# Patient Record
Sex: Female | Born: 1955 | Race: Black or African American | Hispanic: No | Smoking: Never smoker
Health system: Southern US, Community
[De-identification: ages and names within clinical notes are randomized; demographics above are authoritative.]

## PROBLEM LIST (undated history)

## (undated) DIAGNOSIS — E7800 Pure hypercholesterolemia, unspecified: Secondary | ICD-10-CM

## (undated) DIAGNOSIS — I10 Essential (primary) hypertension: Secondary | ICD-10-CM

## (undated) HISTORY — PX: TONSILLECTOMY: SUR1361

---

## 2006-10-01 DIAGNOSIS — E7800 Pure hypercholesterolemia, unspecified: Secondary | ICD-10-CM

## 2006-10-01 HISTORY — DX: Pure hypercholesterolemia, unspecified: E78.00

## 2010-06-04 ENCOUNTER — Emergency Department (HOSPITAL_BASED_OUTPATIENT_CLINIC_OR_DEPARTMENT_OTHER): Admission: EM | Admit: 2010-06-04 | Discharge: 2010-06-04 | Payer: Self-pay | Admitting: Emergency Medicine

## 2010-06-04 ENCOUNTER — Ambulatory Visit: Payer: Self-pay | Admitting: Diagnostic Radiology

## 2011-10-02 HISTORY — PX: TUBAL LIGATION: SHX77

## 2014-07-28 ENCOUNTER — Encounter (HOSPITAL_BASED_OUTPATIENT_CLINIC_OR_DEPARTMENT_OTHER): Payer: Self-pay | Admitting: Emergency Medicine

## 2014-07-28 ENCOUNTER — Emergency Department (HOSPITAL_BASED_OUTPATIENT_CLINIC_OR_DEPARTMENT_OTHER)
Admission: EM | Admit: 2014-07-28 | Discharge: 2014-07-28 | Disposition: A | Discharge status: Home / Self Care | Payer: BC Managed Care – PPO | Attending: Emergency Medicine | Admitting: Emergency Medicine

## 2014-07-28 ENCOUNTER — Emergency Department (HOSPITAL_BASED_OUTPATIENT_CLINIC_OR_DEPARTMENT_OTHER): Payer: BC Managed Care – PPO

## 2014-07-28 DIAGNOSIS — I10 Essential (primary) hypertension: Secondary | ICD-10-CM | POA: Insufficient documentation

## 2014-07-28 DIAGNOSIS — F29 Unspecified psychosis not due to a substance or known physiological condition: Secondary | ICD-10-CM | POA: Diagnosis not present

## 2014-07-28 DIAGNOSIS — M545 Low back pain: Secondary | ICD-10-CM | POA: Diagnosis not present

## 2014-07-28 DIAGNOSIS — G8929 Other chronic pain: Secondary | ICD-10-CM | POA: Diagnosis not present

## 2014-07-28 DIAGNOSIS — Z8639 Personal history of other endocrine, nutritional and metabolic disease: Secondary | ICD-10-CM | POA: Diagnosis not present

## 2014-07-28 DIAGNOSIS — R112 Nausea with vomiting, unspecified: Secondary | ICD-10-CM | POA: Diagnosis not present

## 2014-07-28 DIAGNOSIS — R1031 Right lower quadrant pain: Secondary | ICD-10-CM | POA: Insufficient documentation

## 2014-07-28 DIAGNOSIS — M546 Pain in thoracic spine: Secondary | ICD-10-CM | POA: Insufficient documentation

## 2014-07-28 DIAGNOSIS — Z9851 Tubal ligation status: Secondary | ICD-10-CM | POA: Diagnosis not present

## 2014-07-28 DIAGNOSIS — R109 Unspecified abdominal pain: Secondary | ICD-10-CM | POA: Diagnosis present

## 2014-07-28 HISTORY — DX: Essential (primary) hypertension: I10

## 2014-07-28 HISTORY — DX: Pure hypercholesterolemia, unspecified: E78.00

## 2014-07-28 LAB — URINALYSIS, ROUTINE W REFLEX MICROSCOPIC
Bilirubin Urine: NEGATIVE
GLUCOSE, UA: NEGATIVE mg/dL
Hgb urine dipstick: NEGATIVE
KETONES UR: NEGATIVE mg/dL
LEUKOCYTES UA: NEGATIVE
NITRITE: NEGATIVE
PH: 5.5 (ref 5.0–8.0)
Protein, ur: NEGATIVE mg/dL
SPECIFIC GRAVITY, URINE: 1.003 — AB (ref 1.005–1.030)
Urobilinogen, UA: 0.2 mg/dL (ref 0.0–1.0)

## 2014-07-28 LAB — CBC WITH DIFFERENTIAL/PLATELET
BASOS PCT: 0 % (ref 0–1)
Basophils Absolute: 0 10*3/uL (ref 0.0–0.1)
Eosinophils Absolute: 0.1 10*3/uL (ref 0.0–0.7)
Eosinophils Relative: 2 % (ref 0–5)
HEMATOCRIT: 37.2 % (ref 36.0–46.0)
HEMOGLOBIN: 12.4 g/dL (ref 12.0–15.0)
LYMPHS ABS: 1.6 10*3/uL (ref 0.7–4.0)
LYMPHS PCT: 24 % (ref 12–46)
MCH: 27.5 pg (ref 26.0–34.0)
MCHC: 33.3 g/dL (ref 30.0–36.0)
MCV: 82.5 fL (ref 78.0–100.0)
MONO ABS: 0.6 10*3/uL (ref 0.1–1.0)
MONOS PCT: 9 % (ref 3–12)
NEUTROS ABS: 4.4 10*3/uL (ref 1.7–7.7)
NEUTROS PCT: 65 % (ref 43–77)
Platelets: 269 10*3/uL (ref 150–400)
RBC: 4.51 MIL/uL (ref 3.87–5.11)
RDW: 12.3 % (ref 11.5–15.5)
WBC: 6.7 10*3/uL (ref 4.0–10.5)

## 2014-07-28 LAB — COMPREHENSIVE METABOLIC PANEL
ALBUMIN: 4.1 g/dL (ref 3.5–5.2)
ALK PHOS: 72 U/L (ref 39–117)
ALT: 10 U/L (ref 0–35)
ANION GAP: 15 (ref 5–15)
AST: 18 U/L (ref 0–37)
BILIRUBIN TOTAL: 0.3 mg/dL (ref 0.3–1.2)
BUN: 22 mg/dL (ref 6–23)
CHLORIDE: 99 meq/L (ref 96–112)
CO2: 24 meq/L (ref 19–32)
CREATININE: 1.1 mg/dL (ref 0.50–1.10)
Calcium: 9.7 mg/dL (ref 8.4–10.5)
GFR calc Af Amer: 62 mL/min — ABNORMAL LOW (ref >90)
GFR, EST NON AFRICAN AMERICAN: 54 mL/min — AB (ref >90)
GLUCOSE: 125 mg/dL — AB (ref 70–99)
POTASSIUM: 4.4 meq/L (ref 3.7–5.3)
Sodium: 138 mEq/L (ref 137–147)
Total Protein: 8.2 g/dL (ref 6.0–8.3)

## 2014-07-28 MED ORDER — HYDROMORPHONE HCL 1 MG/ML IJ SOLN
0.5000 mg | Freq: Once | INTRAMUSCULAR | Status: AC
  Administered 2014-07-28: 0.5 mg via INTRAVENOUS
  Filled 2014-07-28: qty 1

## 2014-07-28 MED ORDER — IOHEXOL 300 MG/ML  SOLN
25.0000 mL | Freq: Once | INTRAMUSCULAR | Status: AC | PRN
  Administered 2014-07-28: 25 mL via ORAL

## 2014-07-28 MED ORDER — IOHEXOL 300 MG/ML  SOLN
100.0000 mL | Freq: Once | INTRAMUSCULAR | Status: AC | PRN
  Administered 2014-07-28: 100 mL via INTRAVENOUS

## 2014-07-28 MED ORDER — ONDANSETRON 4 MG PO TBDP: ORAL_TABLET | ORAL | Status: DC

## 2014-07-28 MED ORDER — ONDANSETRON HCL 4 MG/2ML IJ SOLN
4.0000 mg | Freq: Once | INTRAMUSCULAR | Status: AC
  Administered 2014-07-28: 4 mg via INTRAVENOUS
  Filled 2014-07-28: qty 2

## 2014-07-28 NOTE — ED Notes (Signed)
Patient transported to CT 

## 2014-07-28 NOTE — Discharge Instructions (Signed)

## 2014-07-28 NOTE — ED Notes (Signed)
Pt stated that yesterday she started having back pain around 1000. Yesterday evening around 1700 the pain started radiating around to her right side and abdomen. Pt stated that she went home last night after work and took ibuprofen the pain eased up some but continued through the night. Pt stated that when she woke up this morning and went to roll over and the pain was worse in her right lower back. Pt states that right now she only has right lower back pain.

## 2014-07-28 NOTE — ED Provider Notes (Signed)
CSN: [id number]     Arrival date & time 07/28/14  [id number] History   First MD Initiated Contact with Patient 07/28/14 1007     Chief Complaint  Patient presents with  . Back Pain  . Flank Pain     (Consider location/radiation/quality/duration/timing/severity/associated sxs/prior Treatment) Patient is a 58 y.o. female presenting with back pain.  Back Pain Location:  Lumbar spine Quality:  Aching and cramping Radiates to: R abdomen. Pain severity:  Severe Pain is:  Same all the time Onset quality:  Sudden Duration:  1 day Timing:  Constant Progression:  Unchanged Chronicity:  New Context: not falling, not physical stress and not recent illness   Relieved by:  Nothing Worsened by:  Nothing tried Ineffective treatments:  None tried Associated symptoms: no abdominal pain, no bladder incontinence, no bowel incontinence, no chest pain, no fever, no numbness, no perianal numbness and no tingling     Past Medical History  Diagnosis Date  . Hypertension   . High cholesterol 2008   Past Surgical History  Procedure Laterality Date  . Tonsillectomy    . Tubal ligation Left 2013   History reviewed. No pertinent family history. History  Substance Use Topics  . Smoking status: Never Smoker   . Smokeless tobacco: Never Used  . Alcohol Use: Yes     Comment: Pt stated that she drinks a couple glasses of a mixed drink occasionally on weekends   OB History   Grav Para Term Preterm Abortions TAB SAB Ect Mult Living                 Review of Systems  Constitutional: Negative for fever.  Cardiovascular: Negative for chest pain.  Gastrointestinal: Negative for abdominal pain and bowel incontinence.  Genitourinary: Negative for bladder incontinence.  Musculoskeletal: Positive for back pain.  Neurological: Negative for tingling and numbness.  All other systems reviewed and are negative.     Allergies  Review of patient's allergies indicates no known allergies.  Home Medications    Prior to Admission medications   Medication Sig Start Date End Date Taking? Authorizing Provider  ibuprofen (ADVIL,MOTRIN) 200 MG tablet Take 200 mg by mouth every 6 (six) hours as needed (Pt stated that she took 4-200mg  tablets this morning at 0055am).   Yes Historical Provider, MD  ondansetron (ZOFRAN ODT) 4 MG disintegrating tablet 4mg  ODT q4 hours prn nausea/vomit 07/28/14   Mirian MoMatthew Inayah Woodin, MD   BP 120/78  Pulse 44  Temp(Src) 98.1 F (36.7 C) (Oral)  Resp 20  Ht 5' (1.524 m)  Wt 185 lb (83.915 kg)  BMI 36.13 kg/m2  SpO2 99% Physical Exam  Vitals reviewed. Constitutional: She is oriented to person, place, and time. She appears well-developed and well-nourished.  HENT:  Head: Normocephalic and atraumatic.  Right Ear: External ear normal.  Left Ear: External ear normal.  Eyes: Conjunctivae and EOM are normal. Pupils are equal, round, and reactive to light.  Neck: Normal range of motion. Neck supple.  Cardiovascular: Normal rate, regular rhythm, normal heart sounds and intact distal pulses.   Pulmonary/Chest: Effort normal and breath sounds normal.  Abdominal: Soft. Bowel sounds are normal. There is tenderness in the right lower quadrant.  Musculoskeletal: Normal range of motion.       Cervical back: Normal.       Thoracic back: She exhibits tenderness. She exhibits no bony tenderness.       Lumbar back: She exhibits tenderness. She exhibits no bony tenderness.  Neurological: She is alert  and oriented to person, place, and time.  Skin: Skin is warm and dry.    ED Course  Procedures (including critical care time) Labs Review Labs Reviewed  URINALYSIS, ROUTINE W REFLEX MICROSCOPIC - Abnormal; Notable for the following:    Specific Gravity, Urine 1.003 (*)    All other components within normal limits  COMPREHENSIVE METABOLIC PANEL - Abnormal; Notable for the following:    Glucose, Bld 125 (*)    GFR calc non Af Amer 54 (*)    GFR calc Af Amer 62 (*)    All other  components within normal limits  CBC WITH DIFFERENTIAL    Imaging Review Ct Abdomen Pelvis W Contrast  07/28/2014   CLINICAL DATA:  Low back pain radiating to the abdomen since yesterday. History of left ovarian removal.  EXAM: CT ABDOMEN AND PELVIS WITH CONTRAST  TECHNIQUE: Multidetector CT imaging of the abdomen and pelvis was performed using the standard protocol following bolus administration of intravenous contrast.  CONTRAST:  25mL OMNIPAQUE IOHEXOL 300 MG/ML SOLN, 100mL OMNIPAQUE IOHEXOL 300 MG/ML SOLN  COMPARISON:  None.  FINDINGS: The lung bases are clear.  The liver demonstrates no focal abnormality. There is no intrahepatic or extrahepatic biliary ductal dilatation. The gallbladder is normal. The spleen demonstrates no focal abnormality.There is a 12 mm hypodense, fluid attenuating right midpole renal mass most consistent with a cyst. There is a 11 mm hypodense left anterior midpole mass which is too small to characterize. The adrenal glands and pancreas are normal. The bladder is unremarkable.  The stomach, duodenum, small intestine, and large intestine demonstrate no contrast extravasation or dilatation. There is a normal caliber appendix in the right lower quadrant without periappendiceal inflammatory changes.There is a small fat containing umbilical hernia. The uterus and right ovary are unremarkable. There is no pneumoperitoneum, pneumatosis, or portal venous gas. There is no abdominal or pelvic free fluid. There is no lymphadenopathy.  The abdominal aorta is normal in caliber with atherosclerosis.  There are no lytic or sclerotic osseous lesions. Degenerative disc disease most severe at L5-S1 with disc space narrowing, bilateral facet arthropathy and bilateral foraminal stenosis. Broad-based disc bulges throughout the lumbar spine most significant at L2-3.  IMPRESSION: 1. No acute abdominal or pelvic pathology. 2. Normal appendix. 3. Lumbar spine spondylosis.   Electronically Signed   By:  Elige KoHetal  Patel   On: 07/28/2014 12:26     EKG Interpretation None      MDM   Final diagnoses:  RLQ abdominal pain  Non-intractable vomiting with nausea, vomiting of unspecified type    58 y.o. female with pertinent PMH of chronic back pain, HTN presents with back pain radiating to abd as described above.  Patient denies fever, has had nausea, without vomiting. On arrival vital signs and physical exam as above, significant for right lower quadrant tenderness. Labs and CT scan as above obtained. CT scan without signs of appendicitis, patient with nonspecific psychosis. Likely etiology of symptoms lumbar strain, however gastroenteritis is other likely possibility. Patient was given strict return precautions for any worsening symptoms, PO intolerance, any other symptoms, voiced understanding and agreed to follow-up..    1. RLQ abdominal pain   2. Non-intractable vomiting with nausea, vomiting of unspecified type         Mirian MoMatthew Eura Radabaugh, MD 07/28/14 1630

## 2015-10-13 ENCOUNTER — Ambulatory Visit: Payer: Self-pay | Admitting: Podiatry

## 2015-10-20 ENCOUNTER — Ambulatory Visit (INDEPENDENT_AMBULATORY_CARE_PROVIDER_SITE_OTHER): Payer: BC Managed Care – PPO | Admitting: Podiatry

## 2015-10-20 ENCOUNTER — Encounter: Payer: Self-pay | Admitting: Podiatry

## 2015-10-20 VITALS — BP 130/77 | HR 72 | Ht 59.5 in | Wt 185.0 lb

## 2015-10-20 DIAGNOSIS — B351 Tinea unguium: Secondary | ICD-10-CM | POA: Diagnosis not present

## 2015-10-20 DIAGNOSIS — M79673 Pain in unspecified foot: Secondary | ICD-10-CM

## 2015-10-20 DIAGNOSIS — M216X9 Other acquired deformities of unspecified foot: Secondary | ICD-10-CM | POA: Diagnosis not present

## 2015-10-20 DIAGNOSIS — M216X1 Other acquired deformities of right foot: Secondary | ICD-10-CM

## 2015-10-20 DIAGNOSIS — M79606 Pain in leg, unspecified: Secondary | ICD-10-CM

## 2015-10-20 DIAGNOSIS — M79604 Pain in right leg: Secondary | ICD-10-CM

## 2015-10-20 NOTE — Patient Instructions (Signed)
Seen for diabetic foot. Noted of thick ingrown nail on left great toe. Noted of tight Achilles tendon and lateral weight shifting. Both great toe nails debrided. Need to do daily stretch exercise for tight tendon. Need custom orthotics.  Otherwise return in 3 months.

## 2015-10-20 NOTE — Progress Notes (Signed)
SUBJECTIVE: 60 y.o. year old female presents for diabetic foot care. 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 Patient is referred by Dr. Julio Sicks. She was diagnosed with diabetic two weeks ago.  REVIEW OF SYSTEMS: Constitutional: negative Eyes: negative Ears, nose, mouth, throat, and face: negative Respiratory: negative Cardiovascular: negative Gastrointestinal: negative Genitourinary:negative Integument/breast: negative Hematologic/lymphatic: negative Musculoskeletal:Bilateral knee pain x 10-12 years. Neurological: negative Endocrine: negative Allergic/Immunologic: negative HTN, Cholesterol, bad knees 10-12 years,  OBJECTIVE: DERMATOLOGIC EXAMINATION: Nails: Thick and hypertrophic nails x 10 with deformity on left great toe. Skin Integrity: No abnormal skin lesions.  VASCULAR EXAMINATION OF LOWER LIMBS: Pedal pulses: All pedal pulses are palpable with normal pulsation.  Capillary Filling times within 3 seconds in all digits.  No edema or ischemic changes noted.  Temperature gradient from tibial crest to dorsum of foot is within normal bilateral.  NEUROLOGIC EXAMINATION OF THE LOWER LIMBS: Achilles DTR is present and within normal. Monofilament (Semmes-Weinstein 10-gm) sensory testing positive 6 out of 6, bilateral. Vibratory sensations(128Hz  turning fork) intact at medial and lateral forefoot bilateral.  Sharp and Dull discriminatory sensations at the plantar ball of hallux is intact bilateral.   MUSCULOSKELETAL EXAMINATION: Positive for tight Achilles tendon on right.  Hyper extended left hallux. Forefoot varus bilateral.   ASSESSMENT: 1. Type I DM. 2. Ankle Equinus right. 3. Bilateral forefoot varus. 4. Deformed mycotic nails both great toes. 5. Pain in lower limbs.   PLAN: Reviewed clinical findings and diabetic foot care. Reviewed stretch exercise for tight Achilles tendon on  right. Dystrophic nails debrided. May benefit from custom orthotics for lower limb pain.

## 2015-10-26 ENCOUNTER — Ambulatory Visit: Payer: BC Managed Care – PPO | Admitting: Podiatry

## 2015-11-01 ENCOUNTER — Ambulatory Visit (INDEPENDENT_AMBULATORY_CARE_PROVIDER_SITE_OTHER): Payer: BC Managed Care – PPO | Admitting: Podiatry

## 2015-11-01 ENCOUNTER — Other Ambulatory Visit: Payer: Self-pay

## 2015-11-01 ENCOUNTER — Encounter: Payer: Self-pay | Admitting: Podiatry

## 2015-11-01 ENCOUNTER — Inpatient Hospital Stay: Admission: RE | Admit: 2015-11-01 | Payer: Self-pay | Source: Ambulatory Visit

## 2015-11-01 VITALS — BP 152/79 | HR 73

## 2015-11-01 DIAGNOSIS — M216X2 Other acquired deformities of left foot: Secondary | ICD-10-CM

## 2015-11-01 DIAGNOSIS — M79673 Pain in unspecified foot: Secondary | ICD-10-CM

## 2015-11-01 DIAGNOSIS — M216X9 Other acquired deformities of unspecified foot: Secondary | ICD-10-CM | POA: Diagnosis not present

## 2015-11-01 DIAGNOSIS — M79606 Pain in leg, unspecified: Secondary | ICD-10-CM

## 2015-11-01 DIAGNOSIS — M216X1 Other acquired deformities of right foot: Secondary | ICD-10-CM | POA: Insufficient documentation

## 2015-11-01 NOTE — Addendum Note (Signed)
Addended by: Charlett Nose on: 11/01/2015 01:39 PM   Modules accepted: Level of Service

## 2015-11-01 NOTE — Patient Instructions (Signed)
Both feet casted for orthotics and x-rays taken. Will call when they are ready.

## 2015-11-01 NOTE — Progress Notes (Signed)
SUBJECTIVE: 60 y.o. year old female presents to have orthotics prepared.  She is a custodian and on feet all day. She gets cramping on legs and gets knee pain.  OBJECTIVE: DERMATOLOGIC EXAMINATION: Nails: Thick and hypertrophic nails x 10 with deformity on left great toe. Skin Integrity: No abnormal skin lesions.  VASCULAR EXAMINATION OF LOWER LIMBS: Pedal pulses: All pedal pulses are palpable with normal pulsation.  Capillary Filling times within 3 seconds in all digits.  No edema or ischemic changes noted.  Temperature gradient from tibial crest to dorsum of foot is within normal bilateral.  NEUROLOGIC EXAMINATION OF THE LOWER LIMBS: Achilles DTR is present and within normal. Monofilament (Semmes-Weinstein 10-gm) sensory testing positive 6 out of 6, bilateral. Vibratory sensations(128Hz  turning fork) intact at medial and lateral forefoot bilateral.  Sharp and Dull discriminatory sensations at the plantar ball of hallux is intact bilateral.   MUSCULOSKELETAL EXAMINATION: Positive for tight Achilles tendon on right.  Hyper extended left hallux. Forefoot varus bilateral.   X-ray: Ap view reveal adductus and spread out lesser metatarsals bilateral. 3rd Metatarsal is relatively long. Lesser digital contracture on 4th and 5th bilateral.  Fibular sesamoid position at 3 on both feet. Lateral view show plantar and posterior calcaneal spur, hazy subtalar joint anterior fascet, fused Os trigonum extending out from Talus and Calcaneus at posterior edge of articular surface. Normal CYMA line, Plantar flexed first ray noted.  ASSESSMENT: 1. Type I DM. 2. Ankle Equinus right. 3. Bilateral forefoot varus. 4. Deformed mycotic nails both great toes. 5. Pain in lower limbs.   PLAN: Reviewed clinical findings and diabetic foot care. Reviewed stretch exercise for tight Achilles tendon on right. Dystrophic nails debrided. May benefit from custom orthotics for lower limb pain.

## 2015-11-18 ENCOUNTER — Encounter (HOSPITAL_BASED_OUTPATIENT_CLINIC_OR_DEPARTMENT_OTHER): Payer: Self-pay | Admitting: *Deleted

## 2015-11-18 DIAGNOSIS — I10 Essential (primary) hypertension: Secondary | ICD-10-CM | POA: Diagnosis not present

## 2015-11-18 DIAGNOSIS — H538 Other visual disturbances: Secondary | ICD-10-CM | POA: Insufficient documentation

## 2015-11-18 DIAGNOSIS — R42 Dizziness and giddiness: Secondary | ICD-10-CM | POA: Diagnosis present

## 2015-11-18 DIAGNOSIS — E785 Hyperlipidemia, unspecified: Secondary | ICD-10-CM | POA: Diagnosis not present

## 2015-11-18 DIAGNOSIS — R51 Headache: Secondary | ICD-10-CM | POA: Insufficient documentation

## 2015-11-18 NOTE — ED Notes (Signed)
Pt c/o dizziness and blurred vision x 3 hrs.

## 2015-11-19 ENCOUNTER — Emergency Department (HOSPITAL_BASED_OUTPATIENT_CLINIC_OR_DEPARTMENT_OTHER)
Admission: EM | Admit: 2015-11-19 | Discharge: 2015-11-19 | Disposition: A | Discharge status: Home / Self Care | Payer: BC Managed Care – PPO | Attending: Emergency Medicine | Admitting: Emergency Medicine

## 2015-11-19 ENCOUNTER — Emergency Department (HOSPITAL_BASED_OUTPATIENT_CLINIC_OR_DEPARTMENT_OTHER): Payer: BC Managed Care – PPO

## 2015-11-19 ENCOUNTER — Emergency Department (HOSPITAL_COMMUNITY): Payer: BC Managed Care – PPO

## 2015-11-19 DIAGNOSIS — R42 Dizziness and giddiness: Secondary | ICD-10-CM

## 2015-11-19 LAB — URINALYSIS, ROUTINE W REFLEX MICROSCOPIC
BILIRUBIN URINE: NEGATIVE
Glucose, UA: NEGATIVE mg/dL
HGB URINE DIPSTICK: NEGATIVE
Ketones, ur: NEGATIVE mg/dL
Leukocytes, UA: NEGATIVE
Nitrite: NEGATIVE
PROTEIN: NEGATIVE mg/dL
Specific Gravity, Urine: 1.013 (ref 1.005–1.030)
pH: 5.5 (ref 5.0–8.0)

## 2015-11-19 LAB — COMPREHENSIVE METABOLIC PANEL
ALBUMIN: 4.1 g/dL (ref 3.5–5.0)
ALK PHOS: 53 U/L (ref 38–126)
ALT: 15 U/L (ref 14–54)
ANION GAP: 10 (ref 5–15)
AST: 19 U/L (ref 15–41)
BILIRUBIN TOTAL: 0.3 mg/dL (ref 0.3–1.2)
BUN: 24 mg/dL — ABNORMAL HIGH (ref 6–20)
CALCIUM: 8.8 mg/dL — AB (ref 8.9–10.3)
CO2: 23 mmol/L (ref 22–32)
Chloride: 107 mmol/L (ref 101–111)
Creatinine, Ser: 1.24 mg/dL — ABNORMAL HIGH (ref 0.44–1.00)
GFR calc Af Amer: 53 mL/min — ABNORMAL LOW (ref >60)
GFR calc non Af Amer: 46 mL/min — ABNORMAL LOW (ref >60)
GLUCOSE: 122 mg/dL — AB (ref 65–99)
Potassium: 3.3 mmol/L — ABNORMAL LOW (ref 3.5–5.1)
Sodium: 140 mmol/L (ref 135–145)
TOTAL PROTEIN: 7.2 g/dL (ref 6.5–8.1)

## 2015-11-19 LAB — RAPID URINE DRUG SCREEN, HOSP PERFORMED
Amphetamines: NOT DETECTED
BENZODIAZEPINES: NOT DETECTED
Barbiturates: NOT DETECTED
Cocaine: NOT DETECTED
Opiates: POSITIVE — AB
Tetrahydrocannabinol: NOT DETECTED

## 2015-11-19 LAB — DIFFERENTIAL
Basophils Absolute: 0 10*3/uL (ref 0.0–0.1)
Basophils Relative: 0 %
EOS PCT: 3 %
Eosinophils Absolute: 0.3 10*3/uL (ref 0.0–0.7)
LYMPHS ABS: 2.7 10*3/uL (ref 0.7–4.0)
LYMPHS PCT: 27 %
MONO ABS: 0.7 10*3/uL (ref 0.1–1.0)
MONOS PCT: 7 %
NEUTROS PCT: 63 %
Neutro Abs: 6.1 10*3/uL (ref 1.7–7.7)

## 2015-11-19 LAB — ETHANOL

## 2015-11-19 LAB — TROPONIN I

## 2015-11-19 LAB — CBC
HEMATOCRIT: 30.7 % — AB (ref 36.0–46.0)
HEMOGLOBIN: 9.9 g/dL — AB (ref 12.0–15.0)
MCH: 26.8 pg (ref 26.0–34.0)
MCHC: 32.2 g/dL (ref 30.0–36.0)
MCV: 83 fL (ref 78.0–100.0)
Platelets: 317 10*3/uL (ref 150–400)
RBC: 3.7 MIL/uL — AB (ref 3.87–5.11)
RDW: 12.1 % (ref 11.5–15.5)
WBC: 9.8 10*3/uL (ref 4.0–10.5)

## 2015-11-19 LAB — PROTIME-INR
INR: 0.96 (ref 0.00–1.49)
Prothrombin Time: 13 seconds (ref 11.6–15.2)

## 2015-11-19 LAB — APTT: aPTT: 39 seconds — ABNORMAL HIGH (ref 24–37)

## 2015-11-19 NOTE — ED Notes (Signed)
Was work this pm had onset of dizziness, blurred vision,  Speech is clear grips equal bilateral

## 2015-11-19 NOTE — ED Notes (Signed)
Patient transported to MRI 

## 2015-11-19 NOTE — ED Provider Notes (Signed)
CSN: [id number]     Arrival date & time 11/18/15  2141 History   First MD Initiated Contact with Patient 11/19/15 0022     Chief Complaint  Patient presents with  . Dizziness     (Consider location/radiation/quality/duration/timing/severity/associated sxs/prior Treatment) HPI  Elizabeth Frazier is a 60 y.o. female with past medical history of hypertension, hyperlipidemia, presenting today with acute onset of dizziness. Patient states she was at work and this occurred suddenly just after 7 PM. She describes dizziness with feedings moving, as well as blurry vision. She denies this ever happening to her in the past. She denied any weakness or numbness in the arms or legs. She has no facial droop or slurred speech. She denies any recent viral URI like infections. There are no further complaints.  10 Systems reviewed and are negative for acute change except as noted in the HPI.      Past Medical History  Diagnosis Date  . Hypertension   . High cholesterol 2008   Past Surgical History  Procedure Laterality Date  . Tonsillectomy    . Tubal ligation Left 2013   History reviewed. No pertinent family history. Social History  Substance Use Topics  . Smoking status: Never Smoker   . Smokeless tobacco: Never Used  . Alcohol Use: 0.0 oz/week    0 Standard drinks or equivalent per week     Comment: Pt stated that she drinks a couple glasses of a mixed drink occasionally on weekends   OB History    No data available     Review of Systems    Allergies  Review of patient's allergies indicates no known allergies.  Home Medications   Prior to Admission medications   Medication Sig Start Date End Date Taking? Authorizing Provider  amLODipine (NORVASC) 5 MG tablet  10/13/15   Historical Provider, MD  clotrimazole-betamethasone (LOTRISONE) cream  07/20/15   Historical Provider, MD  diclofenac sodium (VOLTAREN) 1 % GEL  08/22/15   Historical Provider, MD  DUEXIS 800-26.6 MG TABS  10/14/15    Historical Provider, MD  ibuprofen (ADVIL,MOTRIN) 200 MG tablet Take 200 mg by mouth every 6 (six) hours as needed (Pt stated that she took 4-200mg  tablets this morning at 0055am).    Historical Provider, MD  lisinopril-hydrochlorothiazide (PRINZIDE,ZESTORETIC) 20-25 MG tablet  10/18/15   Historical Provider, MD  ondansetron (ZOFRAN ODT) 4 MG disintegrating tablet  ODT q4 hours prn nausea/vomit 07/28/14   Mirian Mo, MD  rosuvastatin (CRESTOR) 40 MG tablet  10/13/15   Historical Provider, MD  tiZANidine (ZANAFLEX) 4 MG tablet  08/22/15   Historical Provider, MD   BP 119/75 mmHg  Pulse 71  Temp(Src) 98.1 F (36.7 C)  Resp 16  Ht 5' (1.524 m)  Wt 182 lb (82.555 kg)  BMI 35.54 kg/m2  SpO2 100% Physical Exam  Constitutional: She is oriented to person, place, and time. She appears well-developed and well-nourished. No distress.  HENT:  Head: Normocephalic and atraumatic.  Nose: Nose normal.  Mouth/Throat: Oropharynx is clear and moist. No oropharyngeal exudate.  Eyes: Conjunctivae and EOM are normal. Pupils are equal, round, and reactive to light. No scleral icterus.  Neck: Normal range of motion. Neck supple. No JVD present. No tracheal deviation present. No thyromegaly present.  Cardiovascular: Normal rate, regular rhythm and normal heart sounds.  Exam reveals no gallop and no friction rub.   No murmur heard. Pulmonary/Chest: Effort normal and breath sounds normal. No respiratory distress. She has no wheezes. She exhibits no  tenderness.  Abdominal: Soft. Bowel sounds are normal. She exhibits no distension and no mass. There is no tenderness. There is no rebound and no guarding.  Musculoskeletal: Normal range of motion. She exhibits no edema or tenderness.  Lymphadenopathy:    She has no cervical adenopathy.  Neurological: She is alert and oriented to person, place, and time. No cranial nerve deficit. She exhibits normal muscle tone.  Resting horizontal nystagmus present.  Normal  strength and sensation in all extremities, normal cerebellar testing, normal gait.  Skin: Skin is warm and dry. No rash noted. No erythema. No pallor.  Nursing note and vitals reviewed.   ED Course  Procedures (including critical care time) Labs Review Labs Reviewed  APTT - Abnormal; Notable for the following:    aPTT 39 (*)    All other components within normal limits  CBC - Abnormal; Notable for the following:    RBC 3.70 (*)    Hemoglobin 9.9 (*)    HCT 30.7 (*)    All other components within normal limits  COMPREHENSIVE METABOLIC PANEL - Abnormal; Notable for the following:    Potassium 3.3 (*)    Glucose, Bld 122 (*)    BUN 24 (*)    Creatinine, Ser 1.24 (*)    Calcium 8.8 (*)    GFR calc non Af Amer 46 (*)    GFR calc Af Amer 53 (*)    All other components within normal limits  URINE RAPID DRUG SCREEN, HOSP PERFORMED - Abnormal; Notable for the following:    Opiates POSITIVE (*)    All other components within normal limits  URINALYSIS, ROUTINE W REFLEX MICROSCOPIC (NOT AT [facility])  ETHANOL  PROTIME-INR  DIFFERENTIAL  TROPONIN I    Imaging Review Ct Head Wo Contrast  11/19/2015  CLINICAL DATA:  60 year old female with dizziness and blurred vision EXAM: CT HEAD WITHOUT CONTRAST TECHNIQUE: Contiguous axial images were obtained from the base of the skull through the vertex without intravenous contrast. COMPARISON:  None. FINDINGS: The ventricles and sulci are appropriate in size for the patient's age. Mild periventricular and deep white matter chronic microvascular ischemic changes noted. There is no acute intracranial hemorrhage. No mass effect or midline shift. The visualized paranasal sinuses and mastoid air cells are well aerated. The calvarium is intact. IMPRESSION: No acute intracranial hemorrhage. Mild age-related atrophy and chronic microvascular ischemic disease. If symptoms persist and there are no contraindications, MRI may provide better evaluation if clinically  indicated Electronically Signed   By: Elgie Collard M.D.   On: 11/19/2015 01:19   I have personally reviewed and evaluated these images and lab results as part of my medical decision-making.   EKG Interpretation None      MDM   Final diagnoses:  Dizziness    Patient presents to the ED for sudden onset dizziness and blurry vision. She has resting nystagmus on exam as well which concerns me for an acute stroke.  CT head here is negative.  I spoke with Dr. Amada Jupiter with neurology and he agrees with the plan to transfer to [facility] for [facility].  I spoke with Dr. Wilkie Aye who accepts the patient to the ED.  Plan to admit if MRI shows an acute stroke.  Otherwise, if negative, patient is safe for DC home and neurology follow up.   Tomasita Crumble, MD 11/19/15 [phone]

## 2015-11-19 NOTE — Discharge Instructions (Signed)

## 2015-11-19 NOTE — ED Notes (Signed)
Pt from [facility] transfer, at 1900 pt got dizzy and had blurry vision. Pt here for MRI.

## 2015-11-19 NOTE — ED Provider Notes (Signed)
By signing my name below, I, Emmanuella Mensah, attest that this documentation has been prepared under the direction and in the presence of Derwood Kaplan, MD. Electronically Signed: Angelene Giovanni, ED Scribe. 11/19/2015. 4:03 AM.   HPI Comments: Elizabeth Frazier is a 60 y.o. female who presents to the Emergency Department from [facility] complaining of gradually improving dizziness and bilateral blurred vision.   4:01 AM- Pt will receive imaging for further evaluation.   7:48 AM MRI is neg for acute stroke. Pt has ambulated. No dizziness right now, just a headache. Advised pcp f/u this week, and pt agrees.  Derwood Kaplan, MD 11/19/15 [phone]

## 2016-01-03 ENCOUNTER — Ambulatory Visit: Payer: BC Managed Care – PPO | Admitting: Podiatry

## 2016-01-18 ENCOUNTER — Ambulatory Visit: Payer: BC Managed Care – PPO | Admitting: Podiatry

## 2019-11-30 DIAGNOSIS — Z1231 Encounter for screening mammogram for malignant neoplasm of breast: Secondary | ICD-10-CM | POA: Diagnosis not present

## 2019-12-10 DIAGNOSIS — N6489 Other specified disorders of breast: Secondary | ICD-10-CM | POA: Diagnosis not present

## 2019-12-10 DIAGNOSIS — R928 Other abnormal and inconclusive findings on diagnostic imaging of breast: Secondary | ICD-10-CM | POA: Diagnosis not present

## 2019-12-28 ENCOUNTER — Emergency Department (HOSPITAL_BASED_OUTPATIENT_CLINIC_OR_DEPARTMENT_OTHER)
Admission: EM | Admit: 2019-12-28 | Discharge: 2019-12-29 | Disposition: A | Discharge status: Home / Self Care | Payer: Medicare Other | Attending: Emergency Medicine | Admitting: Emergency Medicine

## 2019-12-28 ENCOUNTER — Emergency Department (HOSPITAL_BASED_OUTPATIENT_CLINIC_OR_DEPARTMENT_OTHER): Payer: Medicare Other

## 2019-12-28 ENCOUNTER — Encounter (HOSPITAL_BASED_OUTPATIENT_CLINIC_OR_DEPARTMENT_OTHER): Payer: Self-pay | Admitting: *Deleted

## 2019-12-28 ENCOUNTER — Other Ambulatory Visit: Payer: Self-pay

## 2019-12-28 DIAGNOSIS — M25561 Pain in right knee: Secondary | ICD-10-CM | POA: Diagnosis not present

## 2019-12-28 DIAGNOSIS — Y92812 Truck as the place of occurrence of the external cause: Secondary | ICD-10-CM | POA: Insufficient documentation

## 2019-12-28 DIAGNOSIS — S8991XA Unspecified injury of right lower leg, initial encounter: Secondary | ICD-10-CM | POA: Diagnosis present

## 2019-12-28 DIAGNOSIS — T148XXA Other injury of unspecified body region, initial encounter: Secondary | ICD-10-CM

## 2019-12-28 DIAGNOSIS — I10 Essential (primary) hypertension: Secondary | ICD-10-CM | POA: Diagnosis not present

## 2019-12-28 DIAGNOSIS — Y999 Unspecified external cause status: Secondary | ICD-10-CM | POA: Diagnosis not present

## 2019-12-28 DIAGNOSIS — X509XXA Other and unspecified overexertion or strenuous movements or postures, initial encounter: Secondary | ICD-10-CM | POA: Diagnosis not present

## 2019-12-28 DIAGNOSIS — M25551 Pain in right hip: Secondary | ICD-10-CM | POA: Insufficient documentation

## 2019-12-28 DIAGNOSIS — S86911A Strain of unspecified muscle(s) and tendon(s) at lower leg level, right leg, initial encounter: Secondary | ICD-10-CM | POA: Diagnosis not present

## 2019-12-28 DIAGNOSIS — M79651 Pain in right thigh: Secondary | ICD-10-CM | POA: Diagnosis not present

## 2019-12-28 DIAGNOSIS — Y9339 Activity, other involving climbing, rappelling and jumping off: Secondary | ICD-10-CM | POA: Insufficient documentation

## 2019-12-28 DIAGNOSIS — R52 Pain, unspecified: Secondary | ICD-10-CM

## 2019-12-28 DIAGNOSIS — M12861 Other specific arthropathies, not elsewhere classified, right knee: Secondary | ICD-10-CM | POA: Diagnosis not present

## 2019-12-28 MED ORDER — AMLODIPINE BESYLATE 5 MG PO TABS: 5.0000 mg | ORAL_TABLET | Freq: Every day | ORAL | 0 refills | Status: AC

## 2019-12-28 MED ORDER — DIAZEPAM 5 MG PO TABS
5.0000 mg | ORAL_TABLET | Freq: Once | ORAL | Status: AC
  Administered 2019-12-28: 5 mg via ORAL
  Filled 2019-12-28: qty 1

## 2019-12-28 MED ORDER — LISINOPRIL-HYDROCHLOROTHIAZIDE 20-25 MG PO TABS: 1.0000 | ORAL_TABLET | Freq: Every day | ORAL | 0 refills | Status: AC

## 2019-12-28 NOTE — ED Triage Notes (Signed)
reptorts she picked up her left left to get into a car and felt a pop in her right hip. Ambulatory with cane since that time.

## 2019-12-28 NOTE — Discharge Instructions (Addendum)
You were evaluated in the Emergency Department and after careful evaluation, we did not find any emergent condition requiring admission or further testing in the hospital.  Your exam/testing today is overall reassuring.  X-rays did not show any broken bones.  We recommend follow-up with your primary care doctor to discuss your high blood pressure.  Please return to the Emergency Department if you experience any worsening of your condition.  We encourage you to follow up with a primary care provider.  Thank you for allowing [place] to be a part of your care.

## 2019-12-28 NOTE — ED Provider Notes (Signed)
[facility] Emergency Department Provider Note MRN:  [id number]  Arrival date & time: 12/28/19     Chief Complaint   Leg Pain   History of Present Illness   Elizabeth Frazier is a 64 y.o. year-old female with a history of hypertension presenting to the ED with chief complaint of leg pain.  Patient explains that she was trying to climb into a truck a couple of hours ago, was using her left leg to climb in and felt a sudden stretching sensation to her right leg, and has had continued pain in the right leg since that time.  Pain is located in the right hip, right knee, constant, moderate to severe, worse with motion or palpation.  Difficulty walking.  Denies any other trauma or complaints.  Review of Systems  A complete 10 system review of systems was obtained and all systems are negative except as noted in the HPI and PMH.   Patient's Health History    Past Medical History:  Diagnosis Date  . High cholesterol 2008  . Hypertension     Past Surgical History:  Procedure Laterality Date  . TONSILLECTOMY    . TUBAL LIGATION Left 2013    History reviewed. No pertinent family history.  Social History   Socioeconomic History  . Marital status: Divorced    Spouse name: Not on file  . Number of children: Not on file  . Years of education: Not on file  . Highest education level: Not on file  Occupational History  . Not on file  Tobacco Use  . Smoking status: Never Smoker  . Smokeless tobacco: Never Used  Substance and Sexual Activity  . Alcohol use: Yes    Alcohol/week: 0.0 standard drinks    Comment: Pt stated that she drinks a couple glasses of a mixed drink occasionally on weekends  . Drug use: No  . Sexual activity: Yes    Birth control/protection: Surgical  Other Topics Concern  . Not on file  Social History Narrative  . Not on file   Social Determinants of Health   Financial Resource Strain:   . Difficulty of Paying Living Expenses:   Food  Insecurity:   . Worried About Programme researcher, broadcasting/film/video in the Last Year:   . Barista in the Last Year:   Transportation Needs:   . Freight forwarder (Medical):   Marland Kitchen Lack of Transportation (Non-Medical):   Physical Activity:   . Days of Exercise per Week:   . Minutes of Exercise per Session:   Stress:   . Feeling of Stress :   Social Connections:   . Frequency of Communication with Friends and Family:   . Frequency of Social Gatherings with Friends and Family:   . Attends Religious Services:   . Active Member of Clubs or Organizations:   . Attends Banker Meetings:   Marland Kitchen Marital Status:   Intimate Partner Violence:   . Fear of Current or Ex-Partner:   . Emotionally Abused:   Marland Kitchen Physically Abused:   . Sexually Abused:      Physical Exam   Vitals:   12/28/19 2030  BP: (!) 229/127  Pulse: 79  Resp: 18  Temp: 98.4 F (36.9 C)  SpO2: 100%    CONSTITUTIONAL: Well-appearing, NAD NEURO:  Alert and oriented x 3, no focal deficits EYES:  eyes equal and reactive ENT/NECK:  no LAD, no JVD CARDIO: Regular rate, well-perfused, normal S1 and S2 PULM:  CTAB no wheezing or rhonchi GI/GU:  normal bowel sounds, non-distended, non-tender MSK/SPINE:  No gross deformities, no edema SKIN:  no rash, atraumatic PSYCH:  Appropriate speech and behavior  *Additional and/or pertinent findings included in MDM below  Diagnostic and Interventional Summary    EKG Interpretation  Date/Time:    Ventricular Rate:    PR Interval:    QRS Duration:   QT Interval:    QTC Calculation:   R Axis:     Text Interpretation:        Labs Reviewed - No data to display  DG Femur Min 2 Views Right  Final Result    DG Knee Complete 4 Views Right  Final Result    DG Pelvis 1-2 Views  Final Result      Medications  diazepam (VALIUM) tablet 5 mg (5 mg Oral Given 12/28/19 2237)     Procedures  /  Critical Care Procedures  ED Course and Medical Decision Making  I have  reviewed the triage vital signs, the nursing notes, and pertinent available records from the EMR.  Pertinent labs & imaging results that were available during my care of the patient were reviewed by me and considered in my medical decision making (see below for details).     Suspect muscle strain or spasm, will x-ray to exclude fracture.  X-rays are unremarkable.  Patient seems more comfortable, was initially hypertensive in the setting of pain but improving.  No headache, no vision change, no neurological deficits, asymptomatic hypertension can be managed by primary care doctor.  Barth Kirks. Sedonia Small, [place] [email or web address]  Final Clinical Impressions(s) / ED Diagnoses     ICD-10-CM   1. Muscle strain  T14.8XXA   2. Pain  R52 DG Pelvis 1-2 Views    DG Pelvis 1-2 Views    ED Discharge Orders    None       Discharge Instructions Discussed with and Provided to Patient:     Discharge Instructions     You were evaluated in the Emergency Department and after careful evaluation, we did not find any emergent condition requiring admission or further testing in the hospital.  Your exam/testing today is overall reassuring.  X-rays did not show any broken bones.  We recommend follow-up with your primary care doctor to discuss your high blood pressure.  Please return to the Emergency Department if you experience any worsening of your condition.  We encourage you to follow up with a primary care provider.  Thank you for allowing [place] to be a part of your care.       Maudie Flakes, MD 12/28/19 2303

## 2019-12-28 NOTE — ED Notes (Signed)
Patient transported to X-ray 

## 2020-01-08 DIAGNOSIS — Z1389 Encounter for screening for other disorder: Secondary | ICD-10-CM | POA: Diagnosis not present

## 2020-01-08 DIAGNOSIS — R062 Wheezing: Secondary | ICD-10-CM | POA: Diagnosis not present

## 2020-01-08 DIAGNOSIS — M170 Bilateral primary osteoarthritis of knee: Secondary | ICD-10-CM | POA: Diagnosis not present

## 2020-01-08 DIAGNOSIS — E1165 Type 2 diabetes mellitus with hyperglycemia: Secondary | ICD-10-CM | POA: Diagnosis not present

## 2020-01-08 DIAGNOSIS — Z0001 Encounter for general adult medical examination with abnormal findings: Secondary | ICD-10-CM | POA: Diagnosis not present

## 2020-01-08 DIAGNOSIS — I10 Essential (primary) hypertension: Secondary | ICD-10-CM | POA: Diagnosis not present

## 2020-01-08 DIAGNOSIS — Z131 Encounter for screening for diabetes mellitus: Secondary | ICD-10-CM | POA: Diagnosis not present

## 2020-01-08 DIAGNOSIS — Z01118 Encounter for examination of ears and hearing with other abnormal findings: Secondary | ICD-10-CM | POA: Diagnosis not present

## 2020-01-08 DIAGNOSIS — Z01021 Encounter for examination of eyes and vision following failed vision screening with abnormal findings: Secondary | ICD-10-CM | POA: Diagnosis not present

## 2020-01-08 DIAGNOSIS — Z136 Encounter for screening for cardiovascular disorders: Secondary | ICD-10-CM | POA: Diagnosis not present

## 2020-01-08 DIAGNOSIS — Z5181 Encounter for therapeutic drug level monitoring: Secondary | ICD-10-CM | POA: Diagnosis not present

## 2020-02-05 DIAGNOSIS — E782 Mixed hyperlipidemia: Secondary | ICD-10-CM | POA: Diagnosis not present

## 2020-02-05 DIAGNOSIS — N179 Acute kidney failure, unspecified: Secondary | ICD-10-CM | POA: Diagnosis not present

## 2020-02-05 DIAGNOSIS — Z0001 Encounter for general adult medical examination with abnormal findings: Secondary | ICD-10-CM | POA: Diagnosis not present

## 2020-02-05 DIAGNOSIS — E1165 Type 2 diabetes mellitus with hyperglycemia: Secondary | ICD-10-CM | POA: Diagnosis not present

## 2020-02-05 DIAGNOSIS — I10 Essential (primary) hypertension: Secondary | ICD-10-CM | POA: Diagnosis not present

## 2020-02-05 DIAGNOSIS — M170 Bilateral primary osteoarthritis of knee: Secondary | ICD-10-CM | POA: Diagnosis not present

## 2020-04-08 DIAGNOSIS — I10 Essential (primary) hypertension: Secondary | ICD-10-CM | POA: Diagnosis not present

## 2020-04-08 DIAGNOSIS — R0602 Shortness of breath: Secondary | ICD-10-CM | POA: Diagnosis not present

## 2020-05-06 DIAGNOSIS — N179 Acute kidney failure, unspecified: Secondary | ICD-10-CM | POA: Diagnosis not present

## 2020-05-06 DIAGNOSIS — I119 Hypertensive heart disease without heart failure: Secondary | ICD-10-CM | POA: Diagnosis not present

## 2020-05-06 DIAGNOSIS — M170 Bilateral primary osteoarthritis of knee: Secondary | ICD-10-CM | POA: Diagnosis not present

## 2020-05-06 DIAGNOSIS — E782 Mixed hyperlipidemia: Secondary | ICD-10-CM | POA: Diagnosis not present

## 2020-05-06 DIAGNOSIS — M545 Low back pain: Secondary | ICD-10-CM | POA: Diagnosis not present

## 2020-05-06 DIAGNOSIS — E1165 Type 2 diabetes mellitus with hyperglycemia: Secondary | ICD-10-CM | POA: Diagnosis not present

## 2020-05-06 DIAGNOSIS — I10 Essential (primary) hypertension: Secondary | ICD-10-CM | POA: Diagnosis not present

## 2021-02-03 IMAGING — DX DG KNEE COMPLETE 4+V*R*
4 series · 4 of 4 positions shown · non-contrast
Comparison: None.

CLINICAL DATA: Acute pain

EXAM:
RIGHT KNEE - COMPLETE 4+ VIEW

[knee ap]
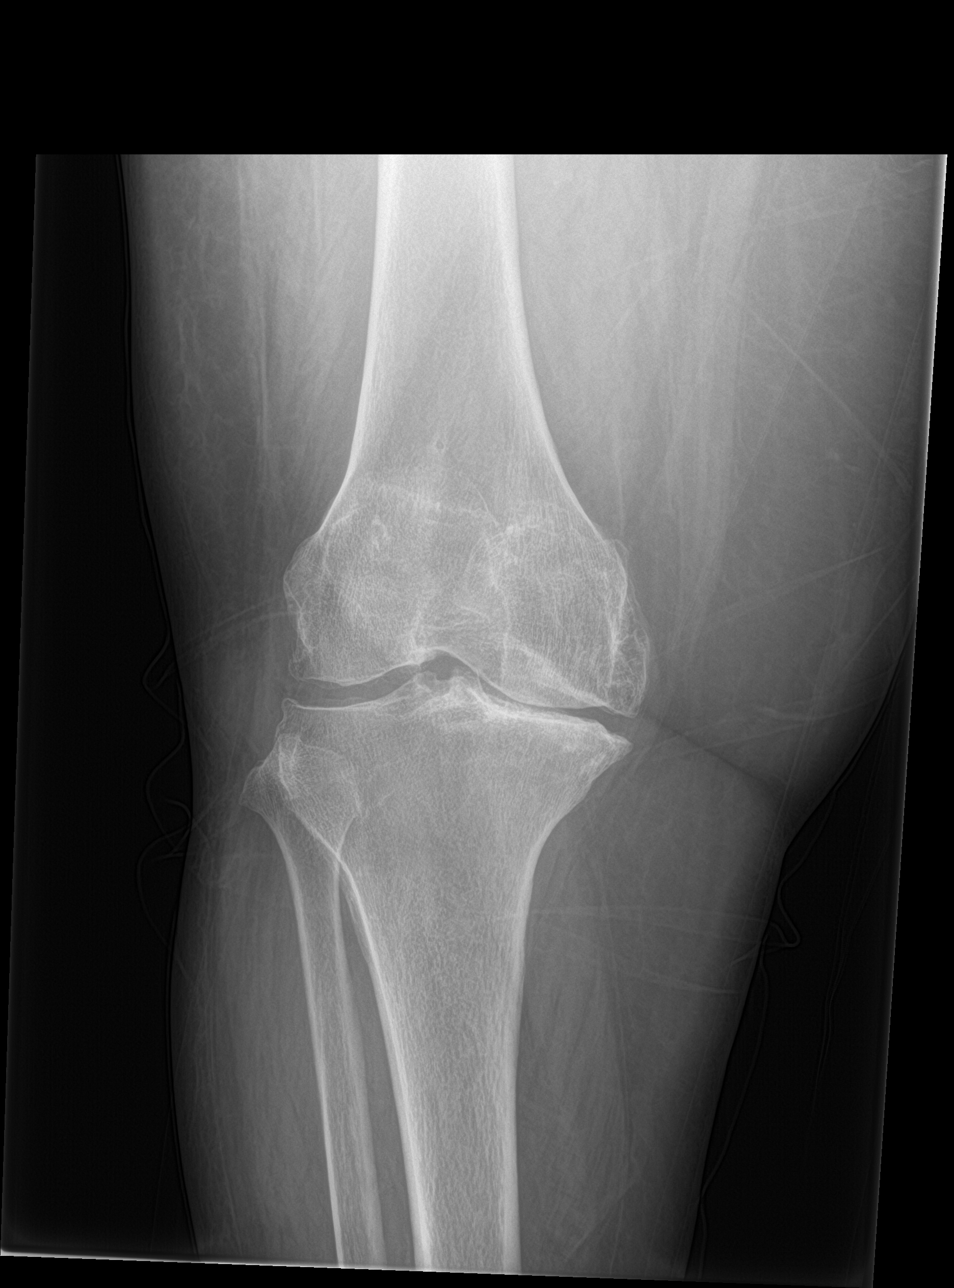

[knee lat]
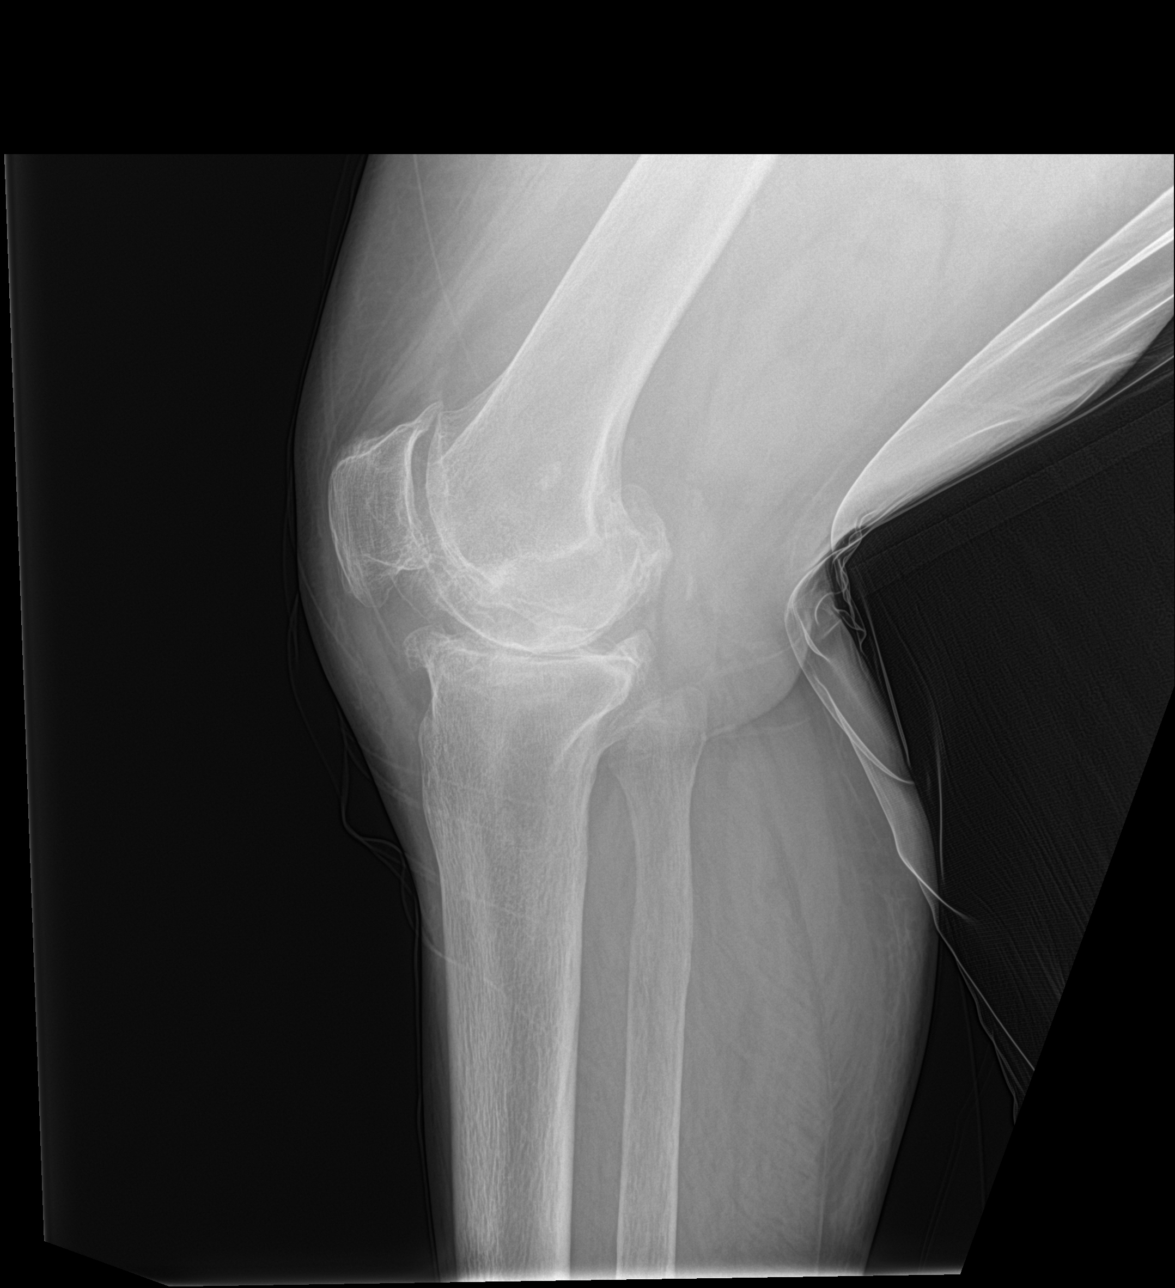

[knee obl (1 of 2)]
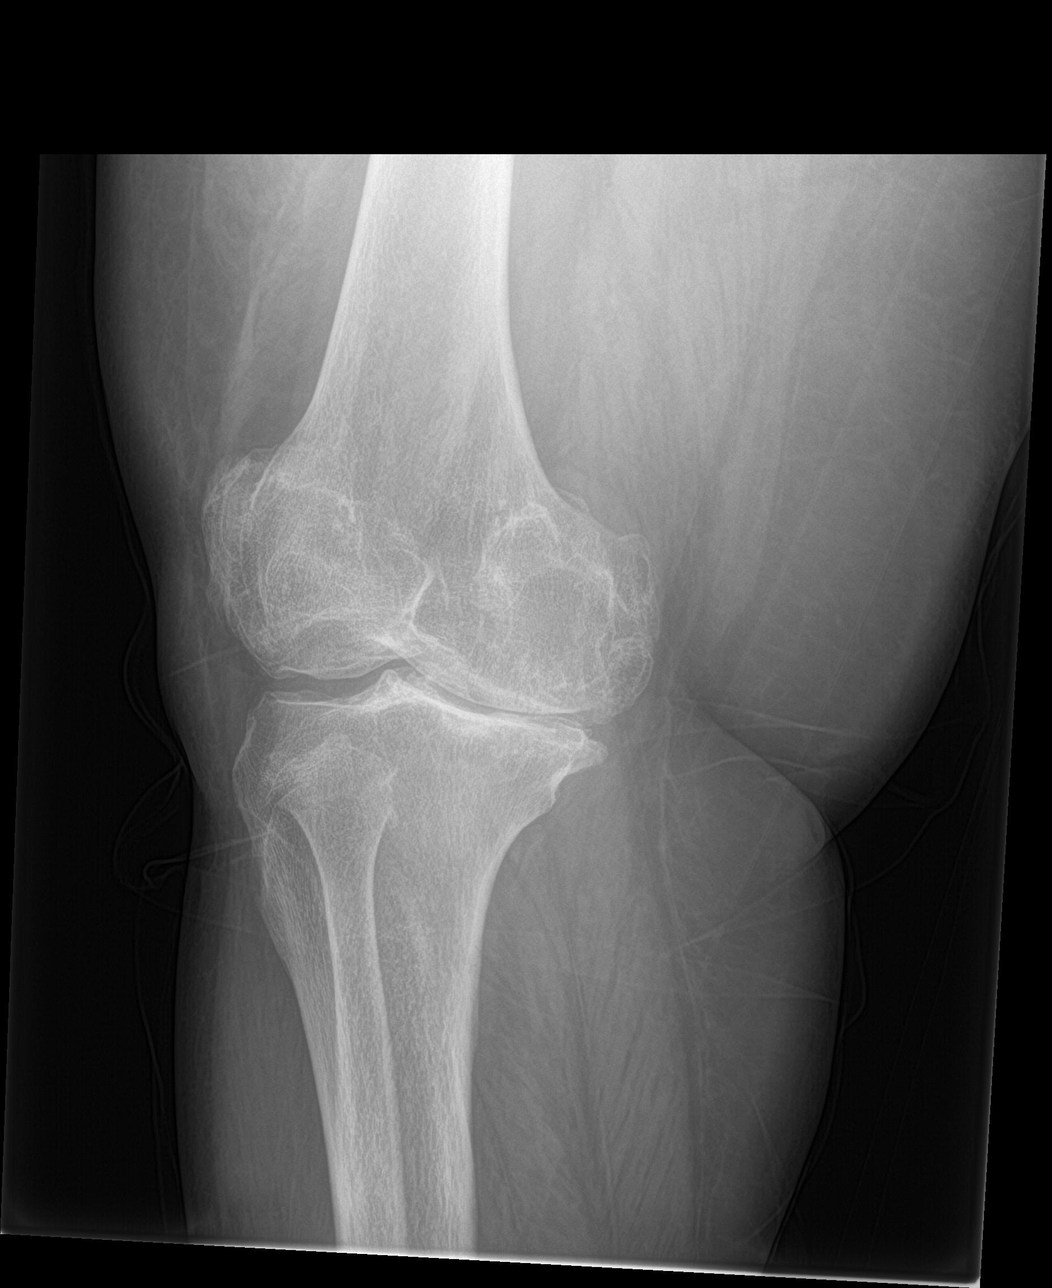

[knee obl (2 of 2)]
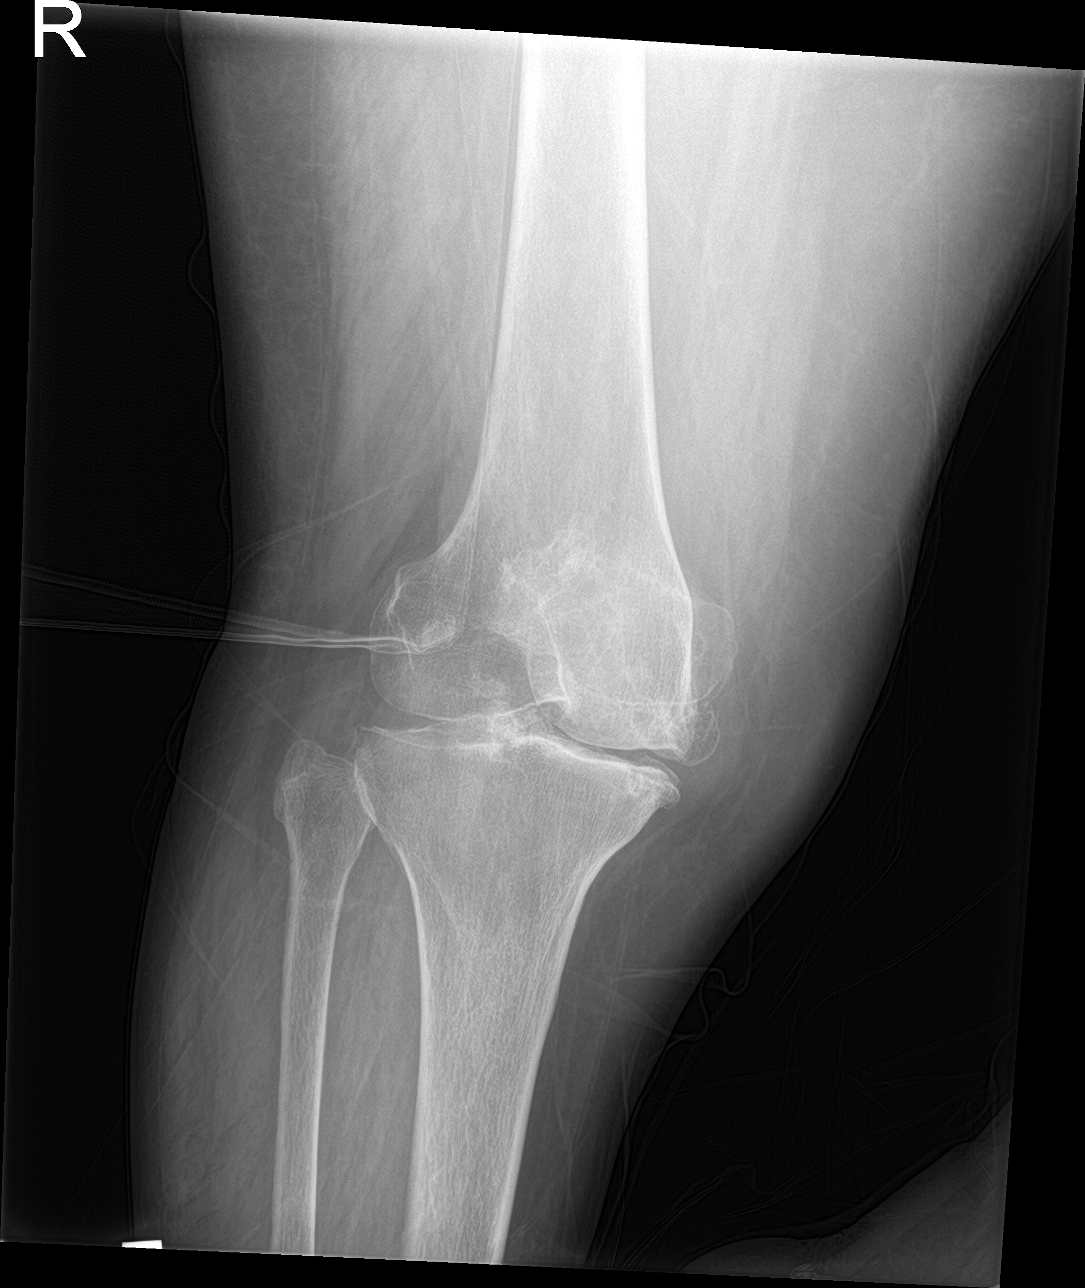

[4 of 4 positions shown; findings below may reference images not displayed]

FINDINGS: No fracture or malalignment. Advanced medial and patellofemoral
degenerative changes with moderate degenerative change of the
lateral joint space. No significant knee effusion.
IMPRESSION: Tricompartment arthritis of the knee.  No acute osseous abnormality.

## 2021-02-03 IMAGING — DX DG FEMUR 2+V*R*
4 series · 4 of 4 positions shown · non-contrast
Comparison: None.

CLINICAL DATA: Acute pain

EXAM:
RIGHT FEMUR 2 VIEWS

[femur ap (1 of 2)]
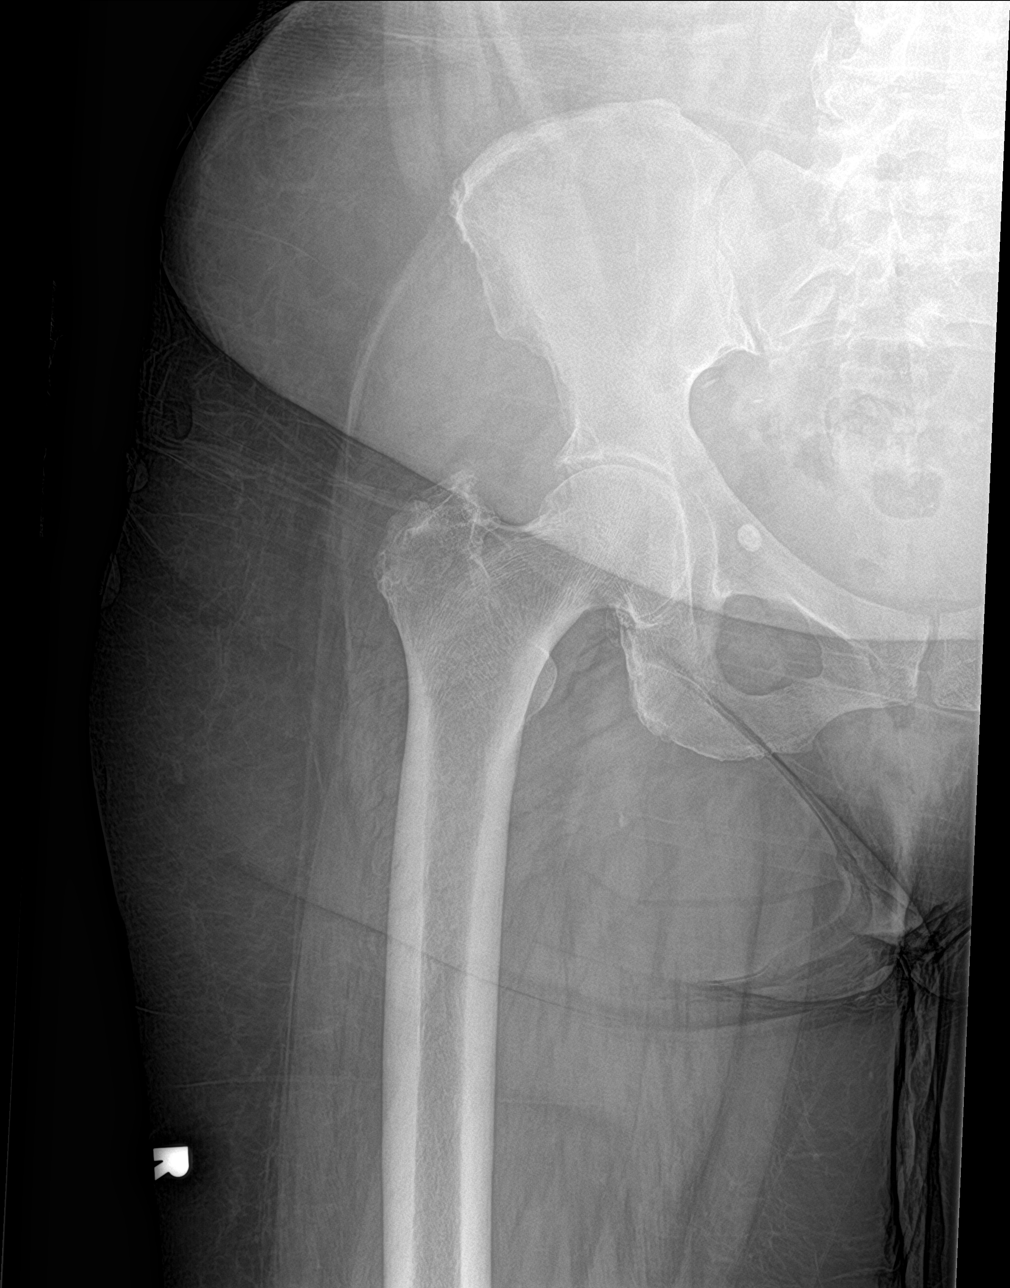

[femur ap (2 of 2)]
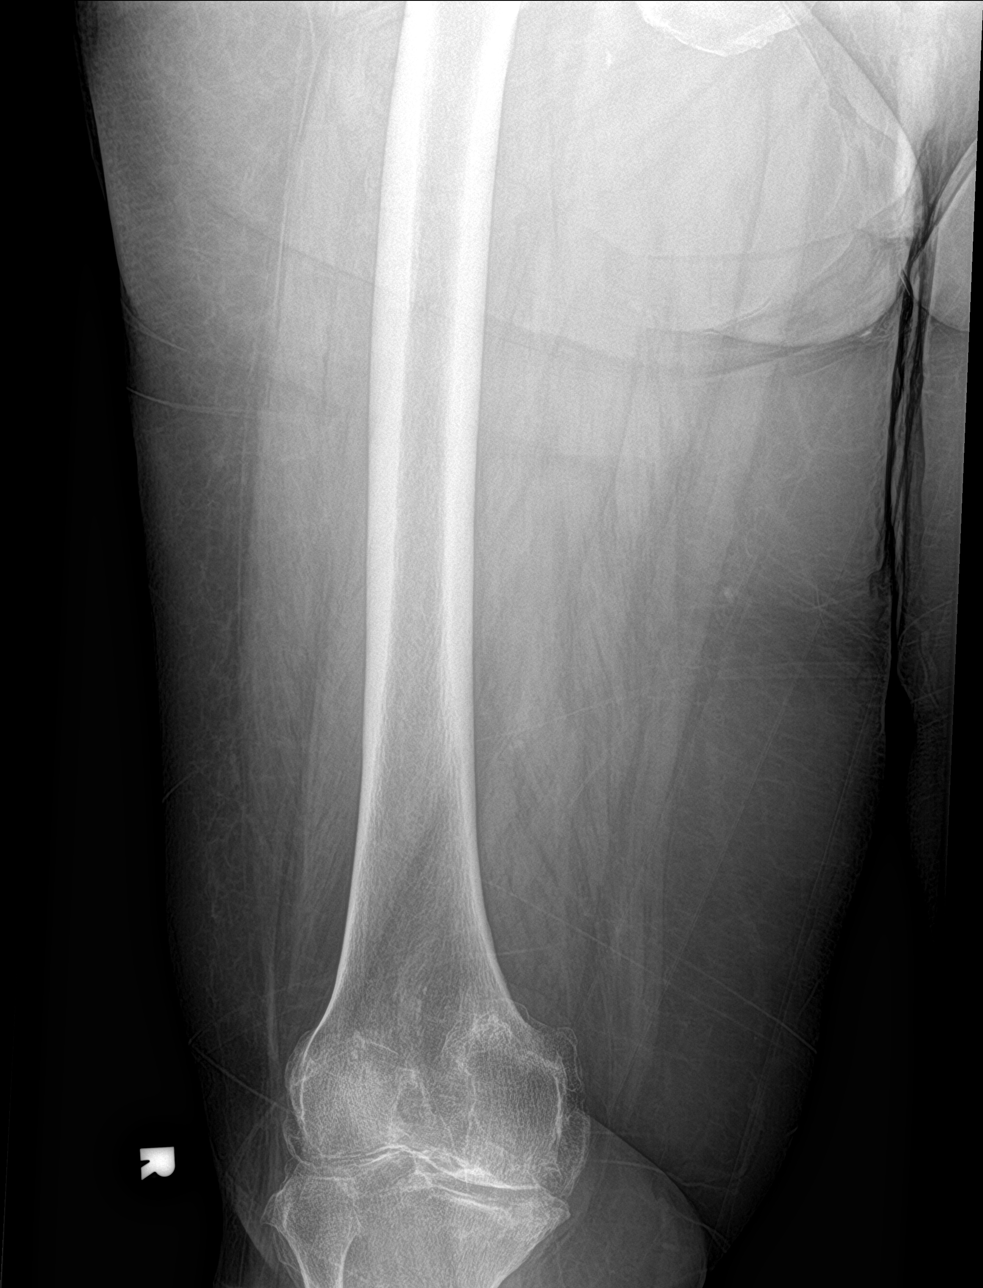

[femur lat (1 of 2)]
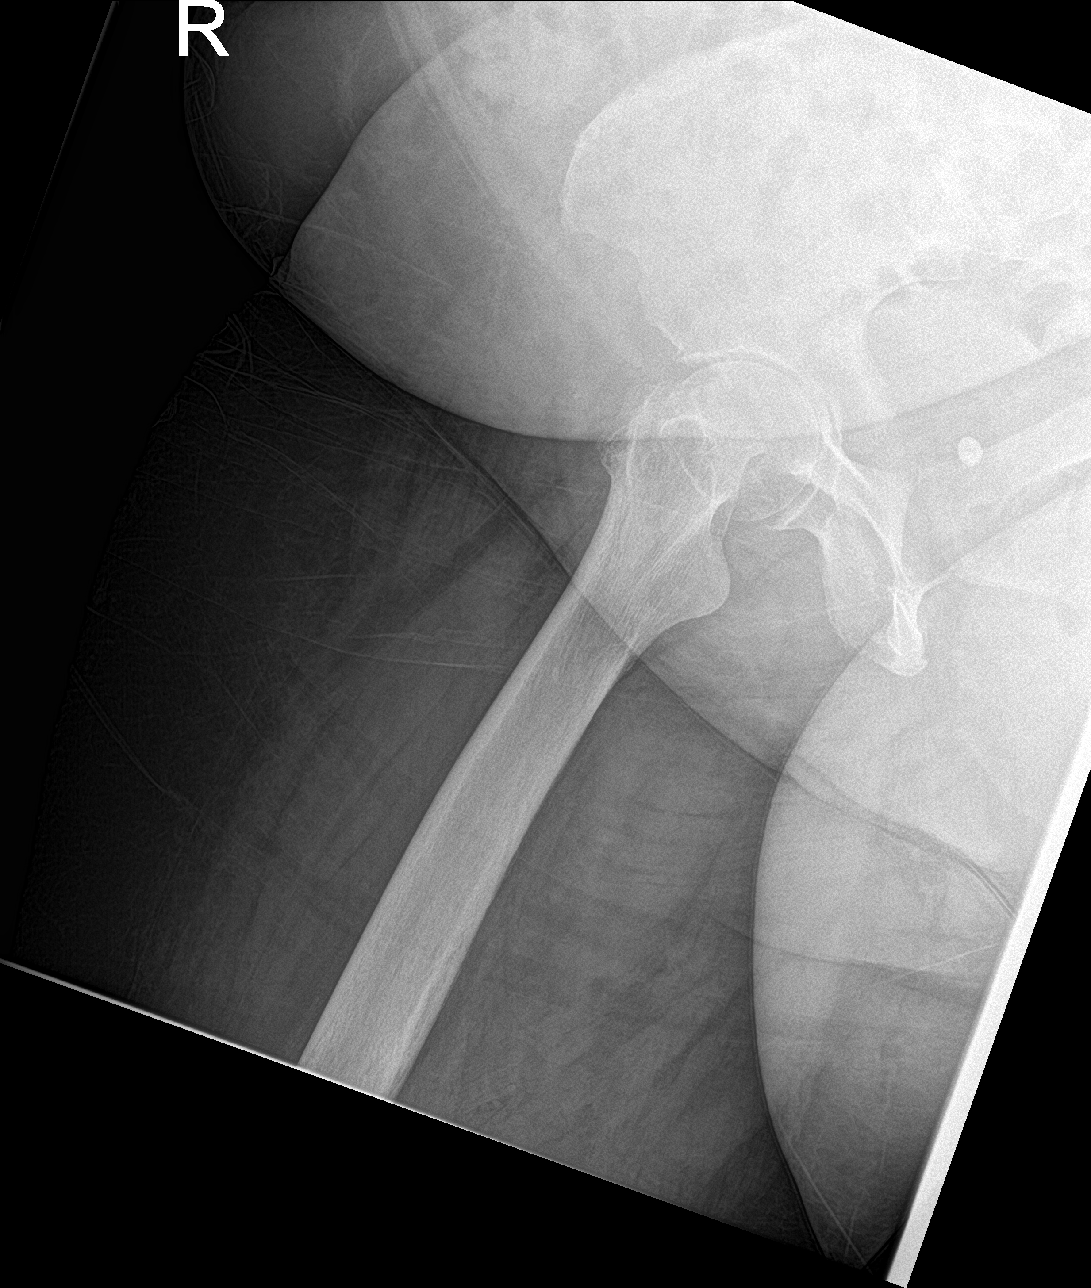

[femur lat (2 of 2)]
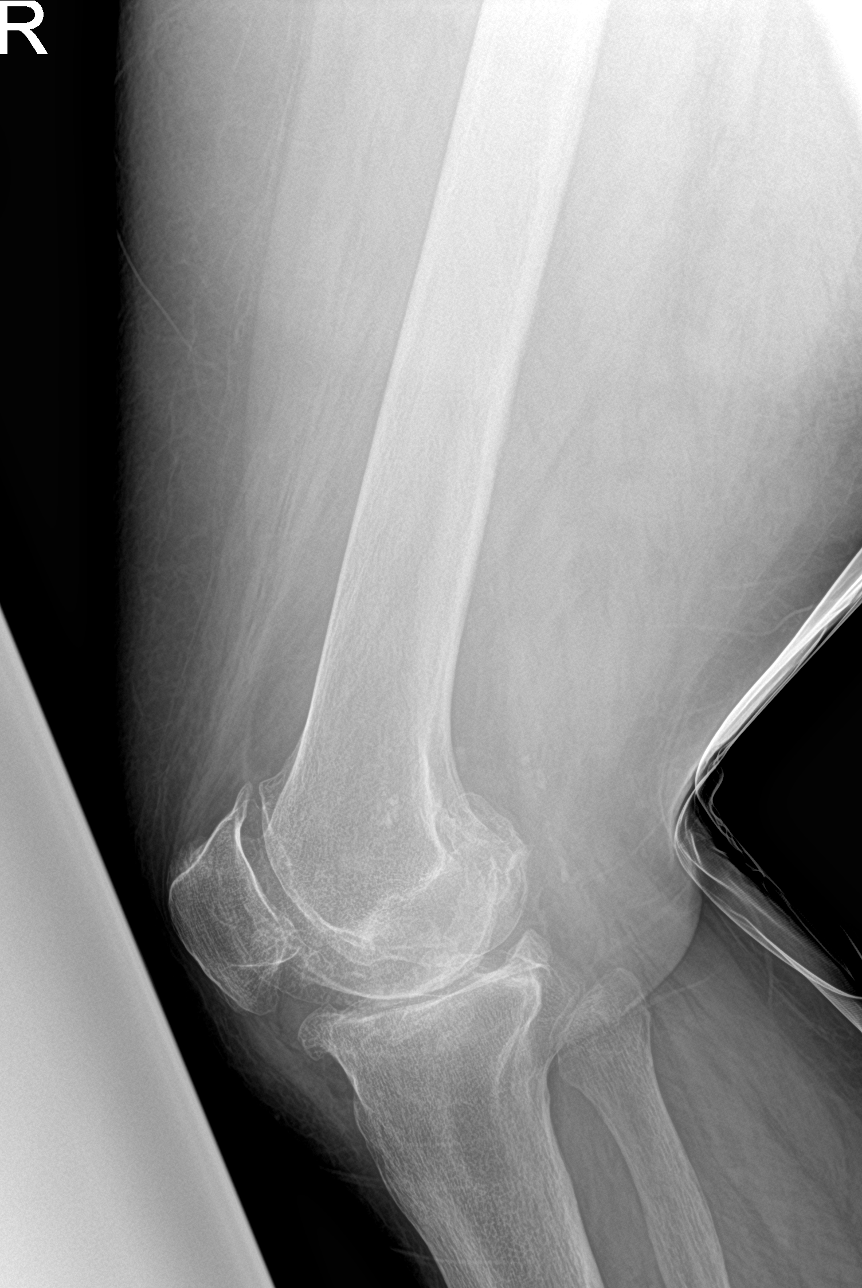

[4 of 4 positions shown; findings below may reference images not displayed]

FINDINGS: No fracture or malalignment. Mild arthritis of the right hip. Soft
tissues are unremarkable
IMPRESSION: No acute osseous abnormality.

## 2021-07-10 DIAGNOSIS — R7303 Prediabetes: Secondary | ICD-10-CM | POA: Diagnosis not present

## 2021-07-10 DIAGNOSIS — I10 Essential (primary) hypertension: Secondary | ICD-10-CM | POA: Diagnosis not present

## 2021-07-10 DIAGNOSIS — Z008 Encounter for other general examination: Secondary | ICD-10-CM | POA: Diagnosis not present

## 2021-07-10 DIAGNOSIS — M170 Bilateral primary osteoarthritis of knee: Secondary | ICD-10-CM | POA: Diagnosis not present

## 2021-07-10 DIAGNOSIS — Z0000 Encounter for general adult medical examination without abnormal findings: Secondary | ICD-10-CM | POA: Diagnosis not present

## 2021-07-10 DIAGNOSIS — Z6839 Body mass index (BMI) 39.0-39.9, adult: Secondary | ICD-10-CM | POA: Diagnosis not present
